# Patient Record
Sex: Female | Born: 1960 | Race: White | Hispanic: No | Marital: Married
Health system: Southern US, Community
[De-identification: ages and names within clinical notes are randomized; demographics above are authoritative.]

---

## 1998-01-17 ENCOUNTER — Other Ambulatory Visit: Admission: RE | Admit: 1998-01-17 | Discharge: 1998-01-17 | Payer: Self-pay | Admitting: Obstetrics and Gynecology

## 1999-02-02 ENCOUNTER — Other Ambulatory Visit: Admission: RE | Admit: 1999-02-02 | Discharge: 1999-02-02 | Payer: Self-pay | Admitting: *Deleted

## 2001-10-19 ENCOUNTER — Encounter: Admission: RE | Admit: 2001-10-19 | Discharge: 2001-10-19 | Payer: Self-pay | Admitting: Family Medicine

## 2001-10-19 ENCOUNTER — Encounter: Payer: Self-pay | Admitting: Family Medicine

## 2014-08-03 ENCOUNTER — Other Ambulatory Visit: Payer: Self-pay | Admitting: Family Medicine

## 2014-08-03 DIAGNOSIS — E010 Iodine-deficiency related diffuse (endemic) goiter: Secondary | ICD-10-CM

## 2014-08-04 ENCOUNTER — Other Ambulatory Visit: Payer: Self-pay

## 2014-08-04 ENCOUNTER — Ambulatory Visit
Admission: RE | Admit: 2014-08-04 | Discharge: 2014-08-04 | Disposition: A | Discharge status: Home / Self Care | Payer: BC Managed Care – PPO | Source: Ambulatory Visit | Attending: Family Medicine | Admitting: Family Medicine

## 2014-08-04 DIAGNOSIS — E010 Iodine-deficiency related diffuse (endemic) goiter: Secondary | ICD-10-CM

## 2014-08-18 ENCOUNTER — Other Ambulatory Visit: Payer: Self-pay | Admitting: Family Medicine

## 2014-08-18 DIAGNOSIS — E041 Nontoxic single thyroid nodule: Secondary | ICD-10-CM

## 2014-08-18 DIAGNOSIS — E042 Nontoxic multinodular goiter: Secondary | ICD-10-CM

## 2014-08-23 ENCOUNTER — Ambulatory Visit
Admission: RE | Admit: 2014-08-23 | Discharge: 2014-08-23 | Disposition: A | Discharge status: Home / Self Care | Payer: BC Managed Care – PPO | Source: Ambulatory Visit | Attending: Family Medicine | Admitting: Family Medicine

## 2014-08-23 ENCOUNTER — Other Ambulatory Visit (HOSPITAL_COMMUNITY)
Admission: RE | Admit: 2014-08-23 | Discharge: 2014-08-23 | Disposition: A | Discharge status: Home / Self Care | Payer: BC Managed Care – PPO | Source: Ambulatory Visit | Attending: Interventional Radiology | Admitting: Interventional Radiology

## 2014-08-23 DIAGNOSIS — E041 Nontoxic single thyroid nodule: Secondary | ICD-10-CM | POA: Diagnosis not present

## 2014-08-23 DIAGNOSIS — F419 Anxiety disorder, unspecified: Secondary | ICD-10-CM | POA: Insufficient documentation

## 2014-08-23 DIAGNOSIS — D649 Anemia, unspecified: Secondary | ICD-10-CM | POA: Insufficient documentation

## 2016-04-25 IMAGING — US US SOFT TISSUE HEAD/NECK
1 series · 14 of 25 positions shown · non-contrast
Comparison: None.

CLINICAL DATA: THYROMEGALY

EXAM:
THYROID ULTRASOUND
TECHNIQUE: Ultrasound examination of the thyroid gland and adjacent soft
tissues was performed.

[Series 1: us soft tissue head/neck · 0.06mm/px · 14 of 54 slices shown]
[im 1/54]
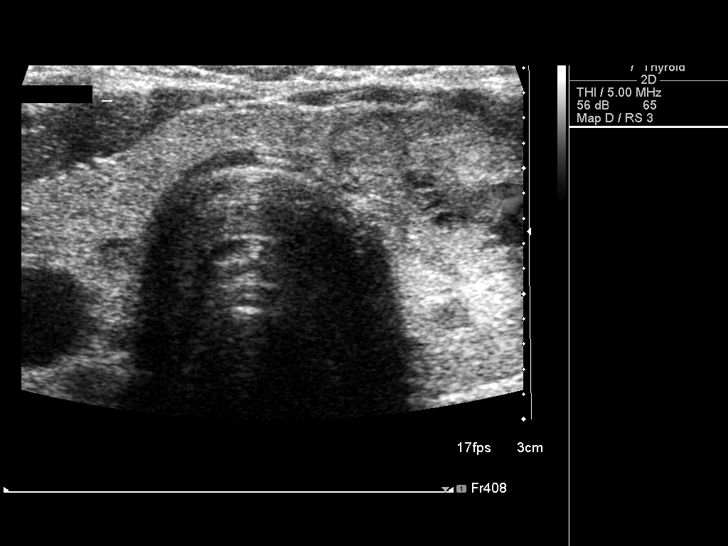
[im 5/54]
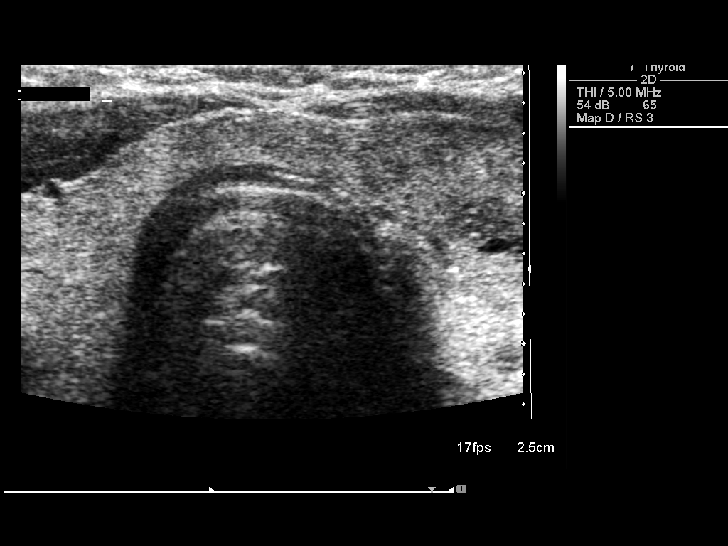
[im 9/54]
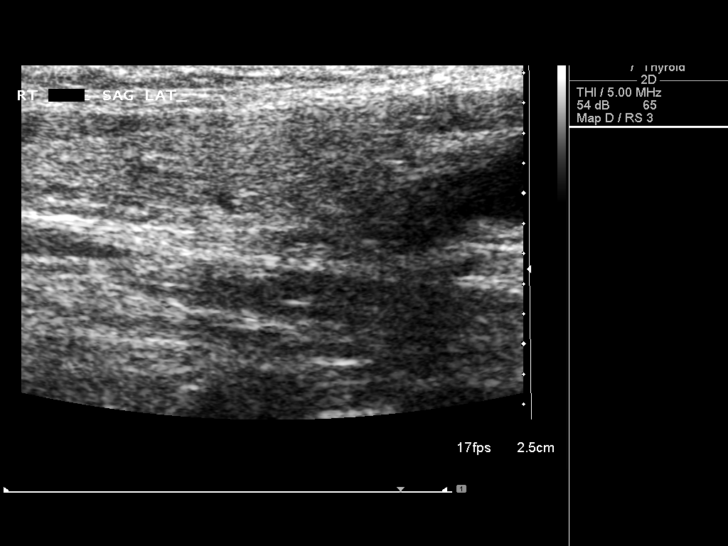
[im 14/54]
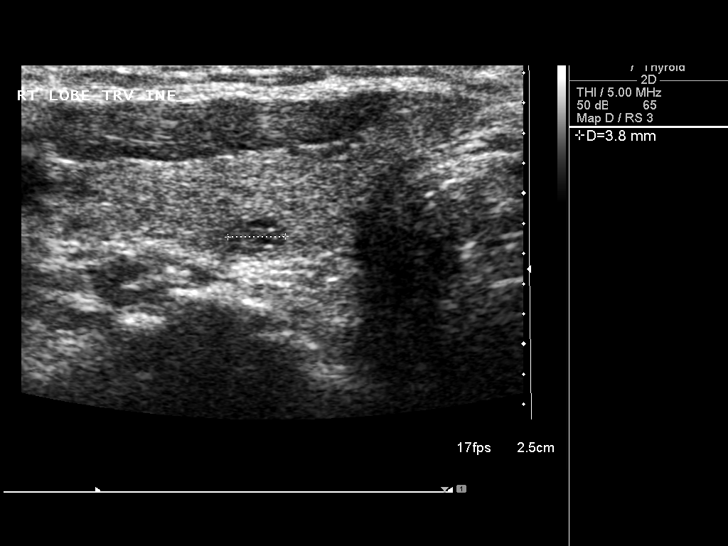
[im 18/54]
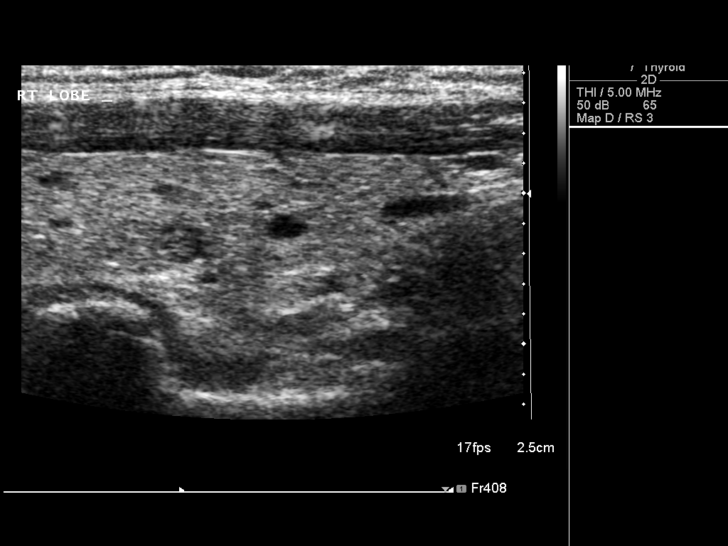
[im 20/54]
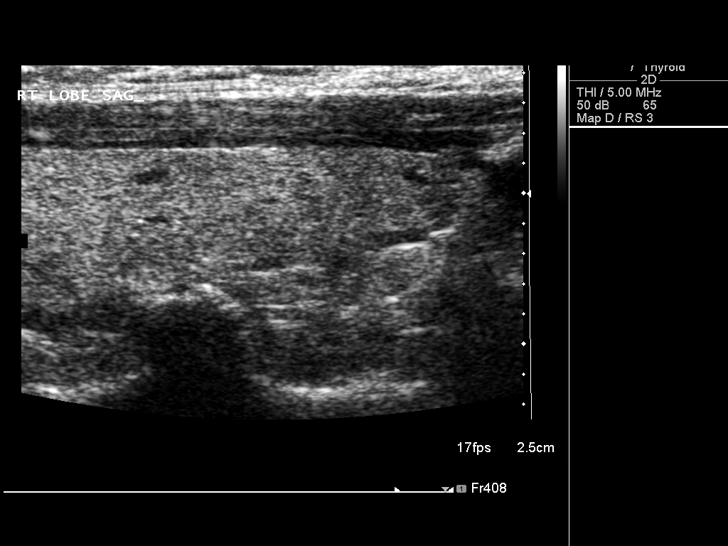
[im 25/54]
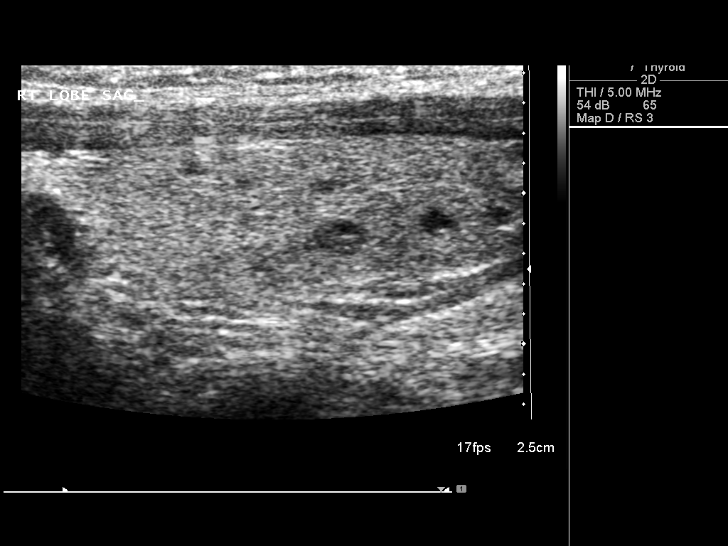
[im 29/54]
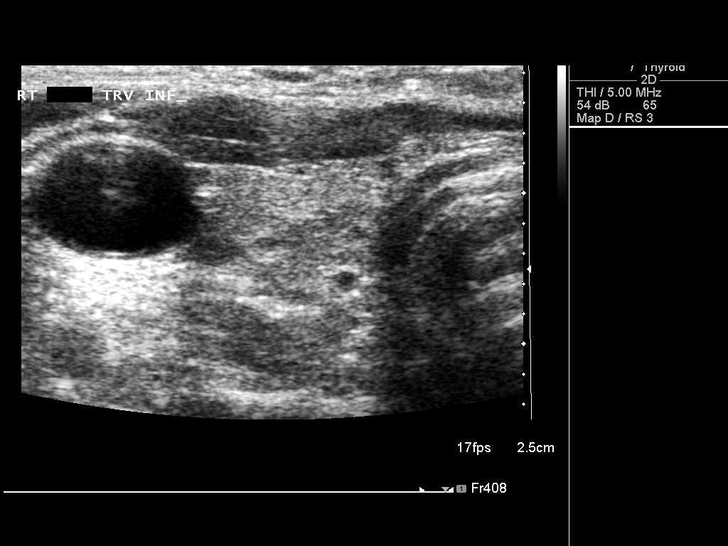
[im 34/54]
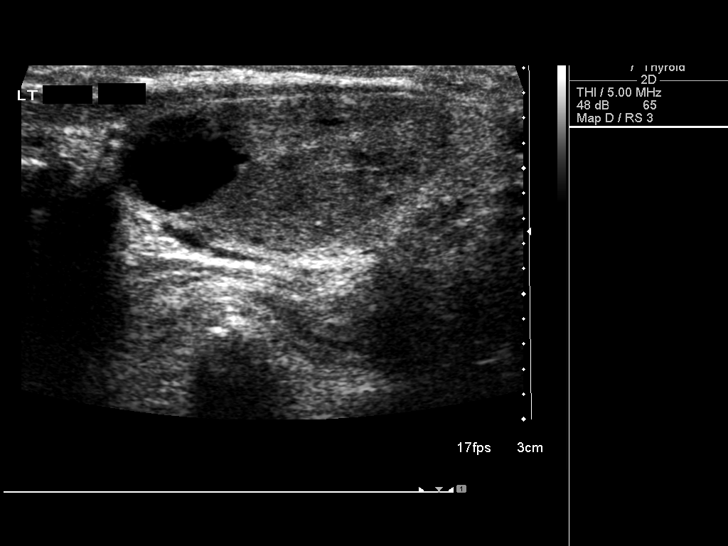
[im 36/54]
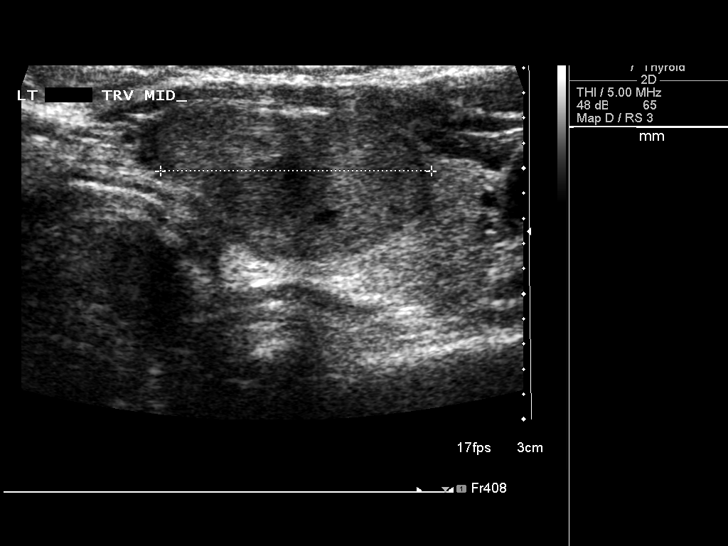
[im 40/54]
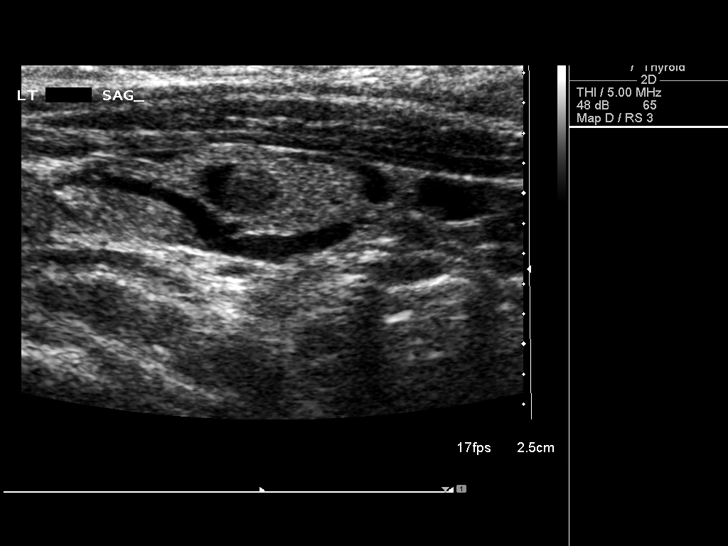
[im 45/54]
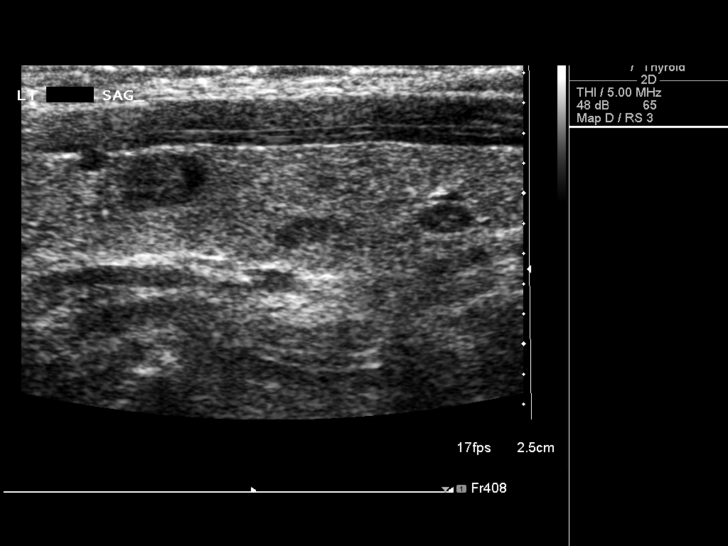
[im 49/54]
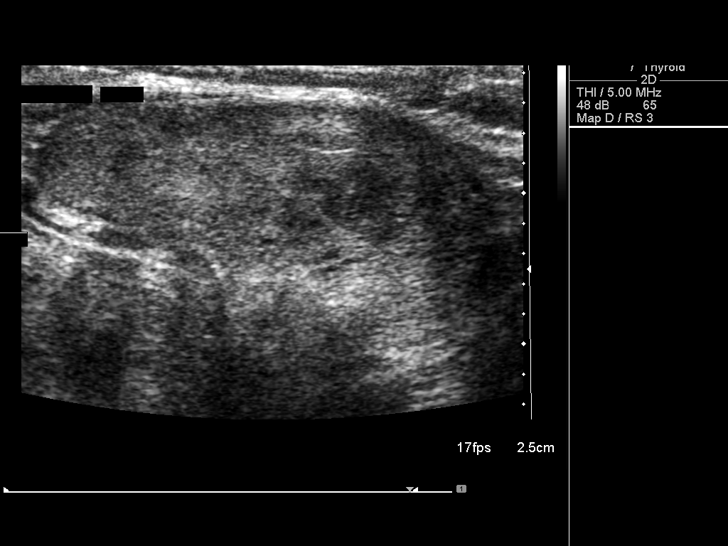
[im 54/54]
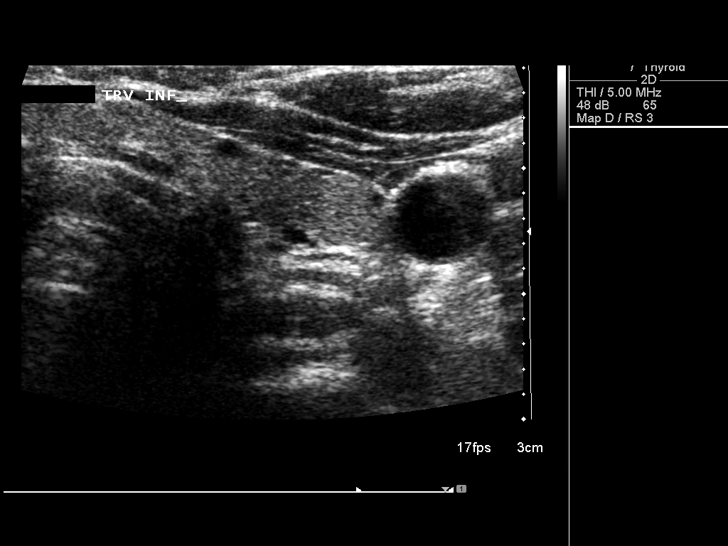

[14 of 25 positions shown; findings below may reference images not displayed]

FINDINGS: Right thyroid lobe

Measurements: 44 x 15 x 18 mm. Mildly inhomogeneous echotexture.
Several small complex cystic nodules measuring 5 mm or less.

Left thyroid lobe

Measurements: 55 x 13 x 19 mm. There is a dominant 27 x 13 x 22 mm
complex mostly solid nodule, mid lobe. Several smaller mid and lower
pole nodules measuring 6 mm or less.

Isthmus

Thickness: 4 mm.  No nodules visualized.

Lymphadenopathy

None seen
IMPRESSION: 1. Mild thyromegaly with bilateral nodules. The dominant left lesion
meets consensus criteria for biopsy. Ultrasound-guided fine needle
aspiration should be considered, as per the consensus statement:
Management of Thyroid Nodules Detected at US: Society of
Radiologists in Ultrasound Consensus Conference Statement. Radiology

## 2016-05-14 IMAGING — US US THYROID BIOPSY
1 series · 6 of 6 positions shown · non-contrast
Comparison: Prior thyroid ultrasound on 08/04/2014.

CLINICAL DATA: Multinodular thyroid goiter with dominant left
thyroid nodule measuring 2.7 cm in greatest diameter. The patient
presents for biopsy.

EXAM:
ULTRASOUND GUIDED NEEDLE ASPIRATE BIOPSY OF THE THYROID GLAND

[Series 1: us thyroid biopsy · 0.06mm/px · 6 acquisitions, 6 frames shown]
[im 1/6]
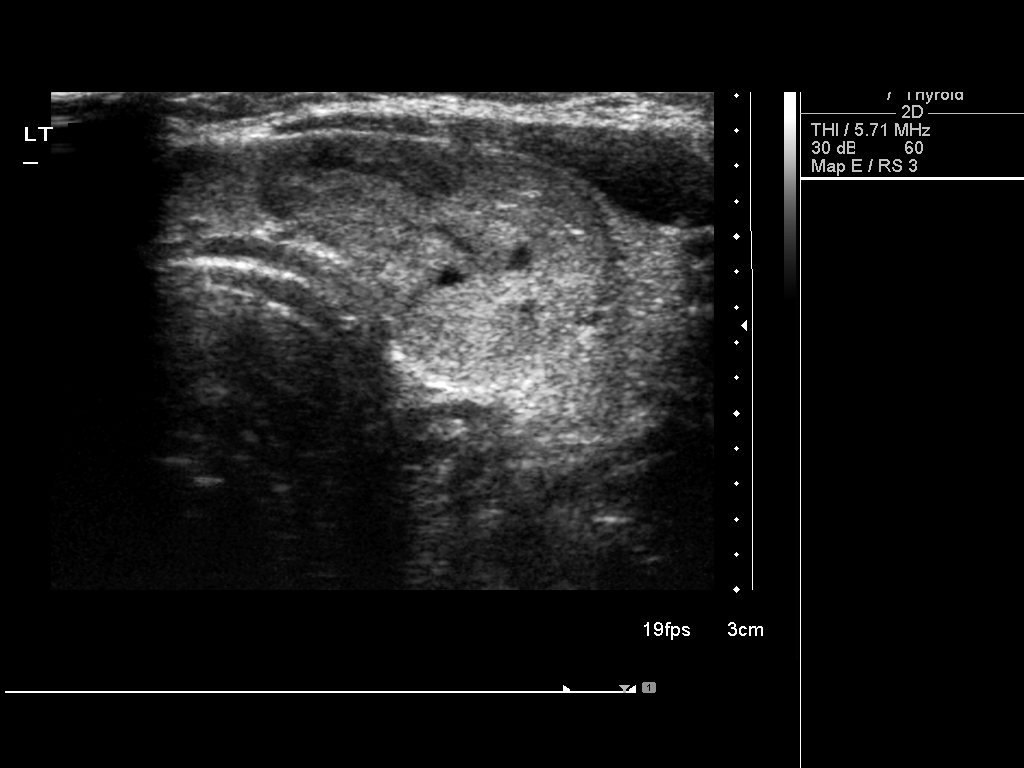
[im 2/6]
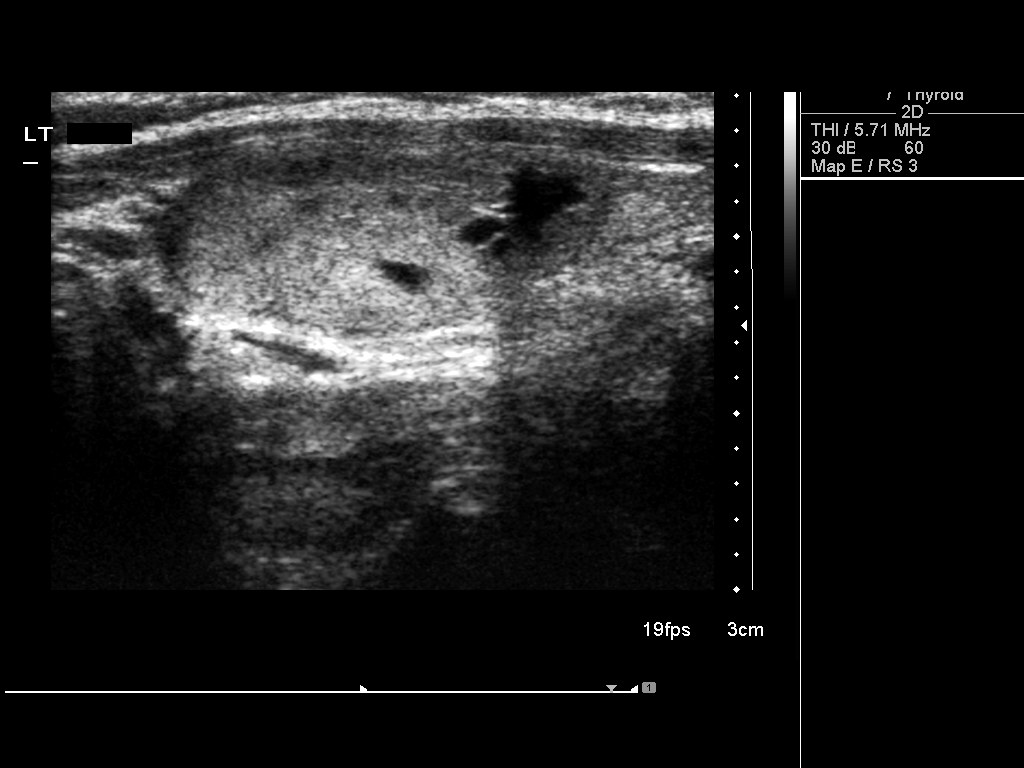
[im 3/6]
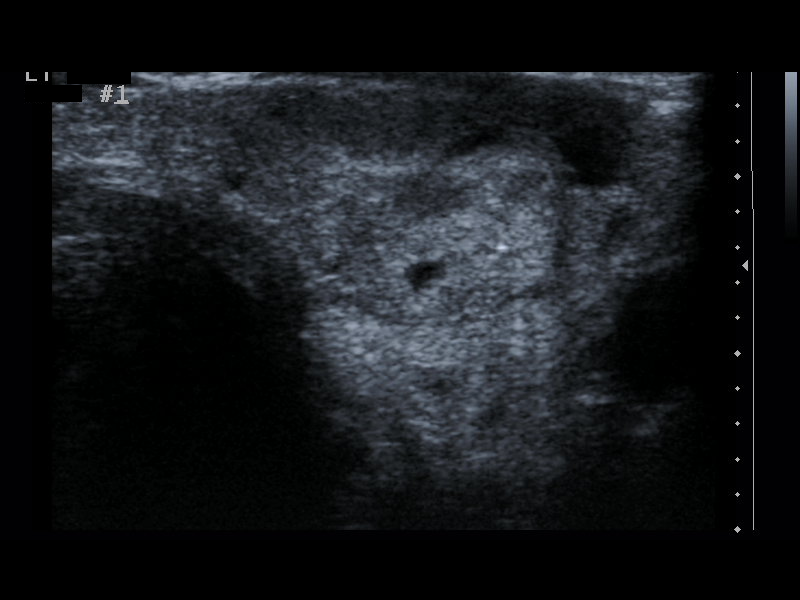
[im 4/6]
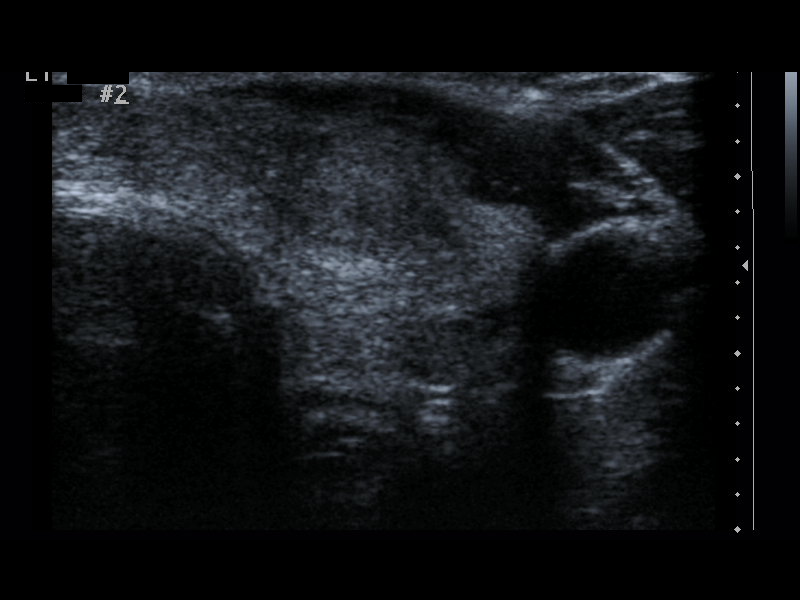
[im 5/6]
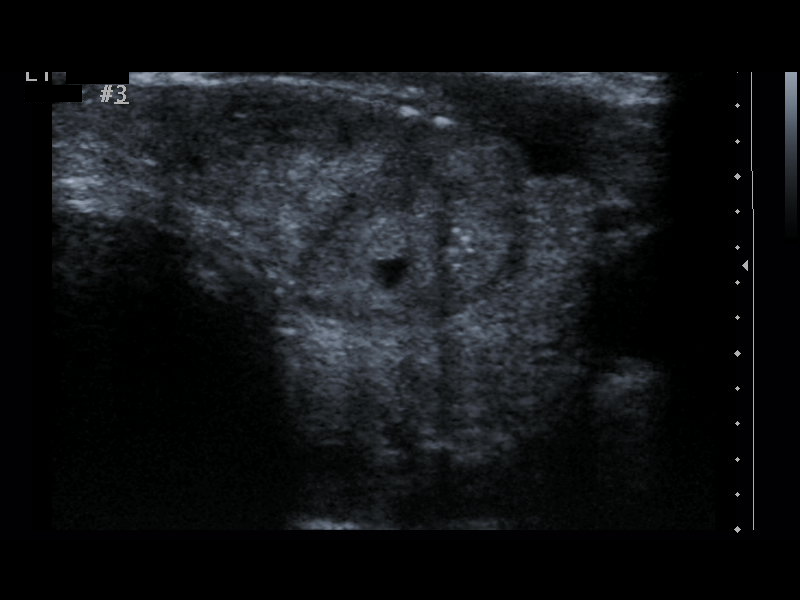
[im 6/6]
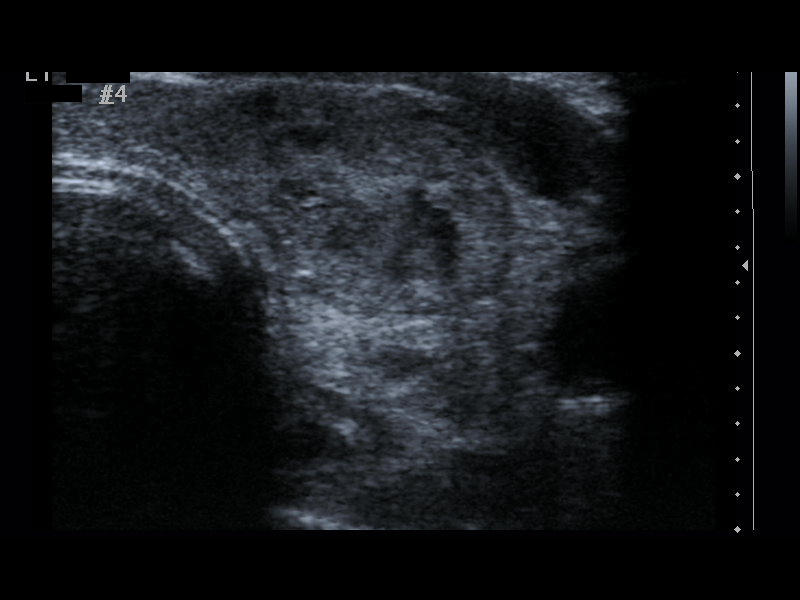

[6 of 6 positions shown; findings below may reference images not displayed]

PROCEDURE:
Thyroid biopsy was thoroughly discussed with the patient and
questions were answered. The benefits, risks, alternatives, and
complications were also discussed. The patient understands and
wishes to proceed with the procedure. Written consent was obtained.

Ultrasound was performed to localize and mark an adequate site for
the biopsy. The patient was then prepped and draped in a normal
sterile fashion. Local anesthesia was provided with 1% lidocaine.
Using direct ultrasound guidance, 4 passes were made using 25 gauge
needles into the nodule within the left lobe of the thyroid.
Ultrasound was used to confirm needle placements on all occasions.
Specimens were sent to Pathology for analysis.

COMPLICATIONS:
None
FINDINGS: Needle aspirate samples were obtained in different portions of the
dominant left thyroid nodule.
IMPRESSION: Ultrasound guided needle aspirate biopsy performed of the dominant
left thyroid nodule.
